# Patient Record
Sex: Female | Born: 1990
Health system: Southern US, Community
[De-identification: ages and names within clinical notes are randomized; demographics above are authoritative.]

## PROBLEM LIST (undated history)

## (undated) DIAGNOSIS — K589 Irritable bowel syndrome without diarrhea: Secondary | ICD-10-CM

## (undated) DIAGNOSIS — F419 Anxiety disorder, unspecified: Secondary | ICD-10-CM

## (undated) HISTORY — DX: Anxiety disorder, unspecified: F41.9

## (undated) HISTORY — DX: Irritable bowel syndrome without diarrhea: K58.9

---

## 2013-10-24 ENCOUNTER — Encounter (HOSPITAL_COMMUNITY): Payer: Self-pay | Admitting: Emergency Medicine

## 2013-10-24 ENCOUNTER — Emergency Department (HOSPITAL_COMMUNITY)
Admission: EM | Admit: 2013-10-24 | Discharge: 2013-10-25 | Disposition: A | Discharge status: Home / Self Care | Payer: BC Managed Care – PPO | Attending: Emergency Medicine | Admitting: Emergency Medicine

## 2013-10-24 DIAGNOSIS — S0990XA Unspecified injury of head, initial encounter: Secondary | ICD-10-CM | POA: Diagnosis present

## 2013-10-24 DIAGNOSIS — S0100XA Unspecified open wound of scalp, initial encounter: Secondary | ICD-10-CM | POA: Diagnosis not present

## 2013-10-24 DIAGNOSIS — W19XXXA Unspecified fall, initial encounter: Secondary | ICD-10-CM

## 2013-10-24 DIAGNOSIS — W1809XA Striking against other object with subsequent fall, initial encounter: Secondary | ICD-10-CM | POA: Diagnosis not present

## 2013-10-24 DIAGNOSIS — Y990 Civilian activity done for income or pay: Secondary | ICD-10-CM | POA: Insufficient documentation

## 2013-10-24 DIAGNOSIS — Y9389 Activity, other specified: Secondary | ICD-10-CM | POA: Diagnosis not present

## 2013-10-24 DIAGNOSIS — S0180XA Unspecified open wound of other part of head, initial encounter: Secondary | ICD-10-CM | POA: Diagnosis not present

## 2013-10-24 DIAGNOSIS — IMO0002 Reserved for concepts with insufficient information to code with codable children: Secondary | ICD-10-CM | POA: Insufficient documentation

## 2013-10-24 DIAGNOSIS — Y9289 Other specified places as the place of occurrence of the external cause: Secondary | ICD-10-CM | POA: Diagnosis not present

## 2013-10-24 DIAGNOSIS — S0101XA Laceration without foreign body of scalp, initial encounter: Secondary | ICD-10-CM

## 2013-10-24 NOTE — ED Provider Notes (Signed)
CSN: [id number]     Arrival date & time 10/24/13  2249 History   First MD Initiated Contact with Patient 10/24/13 2347   This chart was scribed for nurse practitioner Earley Favor, NP working with April Smitty Cords, MD, by Andrew Au, ED Scribe. This patient was seen in room WTR6/WTR6 and the patient's care was started at 11:56 PM.  Chief Complaint  Patient presents with  . Head Injury   Patient is a 23 y.o. female presenting with head injury. The history is provided by the patient. No language interpreter was used.  Head Injury Location:  L temporal Time since incident:  3 hours Mechanism of injury: fall   Pain details:    Quality:  Aching   Severity:  Mild   Timing:  Constant   Progression:  Improving Chronicity:  New Relieved by:  Rest Worsened by:  Nothing tried Associated symptoms: headache   Associated symptoms: no nausea, no neck pain and no vomiting     Amanda Chavez is a 23 y.o. female who presents to the Emergency Department complaining of a head injury. Pt slipped on a puddle of water, causing her to fall and hit her head on the wall. Pt now has an improving HA and mild light headedness. Pt states she took aspirin with relief to HA. She denies LOC, nausea and emesis. Pt denies neck pain.   History reviewed. No pertinent past medical history. History reviewed. No pertinent past surgical history. Family History  Problem Relation Age of Onset  . CAD Other    History  Substance Use Topics  . Smoking status: Never Smoker   . Smokeless tobacco: Not on file  . Alcohol Use: Yes     Comment: occ   OB History   Grav Para Term Preterm Abortions TAB SAB Ect Mult Living                 Review of Systems  Constitutional: Negative for activity change.  Eyes: Negative for visual disturbance.  Gastrointestinal: Negative for nausea and vomiting.  Musculoskeletal: Negative for neck pain.  Skin: Positive for wound.  Neurological: Positive for light-headedness and headaches.  Negative for dizziness and syncope.    Allergies  Sulfa antibiotics  Home Medications   Prior to Admission medications   Not on File   BP 108/72  Pulse 86  Temp(Src) 98.7 F (37.1 C) (Oral)  Resp 18  SpO2 100%  LMP 10/16/2013 Physical Exam  Nursing note and vitals reviewed. Constitutional: She is oriented to person, place, and time. She appears well-developed and well-nourished. No distress.  HENT:  Head: Normocephalic. Head is with laceration.    Right Ear: No hemotympanum.  Left Ear: No hemotympanum.  Eyes: Conjunctivae and EOM are normal. Pupils are equal, round, and reactive to light.  Neck: Normal range of motion. Neck supple.  Cardiovascular: Normal rate.   Pulmonary/Chest: Effort normal.  Musculoskeletal: Normal range of motion.  Neurological: She is alert and oriented to person, place, and time.  Skin: Skin is warm and dry. Abrasion and laceration noted.  Abrasion of the lateral aspect of left patella. .5 cm superficial laceration to the left hairline of the temple.  Psychiatric: She has a normal mood and affect. Her behavior is normal.    ED Course  Procedures (including critical care time) Labs Review Labs Reviewed - No data to display  Imaging Review No results found.   EKG Interpretation None      MDM   Final diagnoses:  Fall, initial encounter  Scalp laceration, initial encounter  Minor head injury without loss of consciousness, initial encounter  no sign of head injury at this time.  I personally performed the services described in this documentation, which was scribed in my presence. The recorded information has been reviewed and is accurate.      Arman Filter, NP 10/25/13 [phone]

## 2013-10-24 NOTE — ED Notes (Signed)
Pt states she slipped in a puddle of water on the floor and fell  Pt states when she fell she hit her head on the wall  Denies LOC  Pt states she had a headache earlier but it is easing off  Pt states she took advil after it happened  Denies blurred vision but has a little light headedness

## 2013-10-24 NOTE — ED Notes (Signed)
Pt reports she fell while at work tonight, slipped on wet floor, hit the corner of the wall.  Small lac noted on L side of her head.  Denies LOC

## 2013-10-25 NOTE — Discharge Instructions (Signed)
Your laceration does not need suturing  Keep the area clean and apply a small amount of antibiotic ointment 1-2 times a day for the next day

## 2013-10-25 NOTE — ED Provider Notes (Signed)
Medical screening examination/treatment/procedure(s) were performed by non-physician practitioner and as supervising physician I was immediately available for consultation/collaboration.   EKG Interpretation None       Alfonza Toft K Braven Wolk-Rasch, MD 10/25/13 0244 

## 2018-07-30 DIAGNOSIS — Z2089 Contact with and (suspected) exposure to other communicable diseases: Secondary | ICD-10-CM | POA: Diagnosis not present

## 2018-07-30 DIAGNOSIS — F411 Generalized anxiety disorder: Secondary | ICD-10-CM | POA: Diagnosis not present

## 2018-09-17 DIAGNOSIS — Z131 Encounter for screening for diabetes mellitus: Secondary | ICD-10-CM | POA: Diagnosis not present

## 2018-09-17 DIAGNOSIS — Z13220 Encounter for screening for lipoid disorders: Secondary | ICD-10-CM | POA: Diagnosis not present

## 2018-09-17 DIAGNOSIS — Z0000 Encounter for general adult medical examination without abnormal findings: Secondary | ICD-10-CM | POA: Diagnosis not present

## 2018-09-17 DIAGNOSIS — R5383 Other fatigue: Secondary | ICD-10-CM | POA: Diagnosis not present

## 2018-09-17 DIAGNOSIS — L659 Nonscarring hair loss, unspecified: Secondary | ICD-10-CM | POA: Diagnosis not present

## 2018-09-30 DIAGNOSIS — L680 Hirsutism: Secondary | ICD-10-CM | POA: Diagnosis not present

## 2018-09-30 DIAGNOSIS — Z01419 Encounter for gynecological examination (general) (routine) without abnormal findings: Secondary | ICD-10-CM | POA: Diagnosis not present

## 2018-09-30 DIAGNOSIS — Z6825 Body mass index (BMI) 25.0-25.9, adult: Secondary | ICD-10-CM | POA: Diagnosis not present

## 2018-10-20 DIAGNOSIS — Z23 Encounter for immunization: Secondary | ICD-10-CM | POA: Diagnosis not present

## 2019-01-14 DIAGNOSIS — Z2089 Contact with and (suspected) exposure to other communicable diseases: Secondary | ICD-10-CM | POA: Diagnosis not present

## 2019-02-07 DIAGNOSIS — Z20828 Contact with and (suspected) exposure to other viral communicable diseases: Secondary | ICD-10-CM | POA: Diagnosis not present

## 2019-02-10 DIAGNOSIS — Z20828 Contact with and (suspected) exposure to other viral communicable diseases: Secondary | ICD-10-CM | POA: Diagnosis not present

## 2019-06-14 DIAGNOSIS — K581 Irritable bowel syndrome with constipation: Secondary | ICD-10-CM | POA: Diagnosis not present

## 2019-07-07 ENCOUNTER — Encounter: Payer: Self-pay | Admitting: Physician Assistant

## 2019-07-29 ENCOUNTER — Encounter: Payer: Self-pay | Admitting: Physician Assistant

## 2019-07-29 ENCOUNTER — Ambulatory Visit: Payer: BC Managed Care – PPO | Admitting: Physician Assistant

## 2019-07-29 VITALS — BP 117/76 | HR 73 | Ht 64.0 in | Wt 153.0 lb

## 2019-07-29 DIAGNOSIS — K581 Irritable bowel syndrome with constipation: Secondary | ICD-10-CM

## 2019-07-29 MED ORDER — LUBIPROSTONE 8 MCG PO CAPS: 8.0000 ug | ORAL_CAPSULE | Freq: Two times a day (BID) | ORAL | 3 refills | Status: DC

## 2019-07-29 NOTE — Patient Instructions (Addendum)
If you are age 28 or older, your body mass index should be between 23-30. Your Body mass index is 26.26 kg/m. If this is out of the aforementioned range listed, please consider follow up with your Primary Care Provider.  If you are age 70 or younger, your body mass index should be between 19-25. Your Body mass index is 26.26 kg/m. If this is out of the aformentioned range listed, please consider follow up with your Primary Care Provider.   START Amitiza 8 mg 1 capsule twice daily at breakfast and dinner. This has been sent to your pharmacy.  You have been scheduled to follow up with Hyacinth Meeker, PA-C on September 02, 2019 at 8:30 am.

## 2019-07-29 NOTE — Progress Notes (Signed)
Reviewed and agree with management plans. ? ?Sissy Goetzke L. Viriginia Amendola, MD, MPH  ?

## 2019-07-29 NOTE — Progress Notes (Signed)
Chief Complaint: Constipation  HPI:    Amanda Chavez is a 29 year old Caucasian female with a past medical history as listed below including anxiety and IBS, who was referred to me by Norberta Keens, PA* for a complaint of constipation.      Today, the patient tells me that since middle school she has had trouble with constipation, she can go up to a week without having a bowel movement, typically then she will use laxatives over the weekend which consists of Dulcolax 3 tabs daily and will repeat this the next day if she does not have movement.  Even when she does have a bowel movement it feels incomplete and the days in between she has a lot of bloating and excess gas which results in burping with abdominal distention which is uncomfortable.  Has tried MiraLAX in the past which does not help.  Has also tried Linzess in the past when seeing [facility] about 3 years ago, but this gave her nausea.  Tries to eat a high-fiber diet with multiple fiber one bars a day and veggies and fruit as well as increased water intake and exercise 4 out of 5 days a week but regardless of this is still constipated.    Denies fever, chills, blood in her stool, family history of colon cancer symptoms that awaken her from sleep.  Past Medical History:  Diagnosis Date  . Anxiety   . IBS (irritable bowel syndrome)     History reviewed. No pertinent surgical history.  Current Outpatient Medications  Medication Sig Dispense Refill  . JUNEL FE 1/20 1-20 MG-MCG tablet Take 1 tablet by mouth daily.    . Probiotic Product (PROBIOTIC DAILY PO) Take 1 capsule by mouth daily.    Marland Kitchen venlafaxine (EFFEXOR) 75 MG tablet Take 75 mg by mouth at bedtime.     No current facility-administered medications for this visit.    Allergies as of 07/29/2019 - Review Complete 07/29/2019  Allergen Reaction Noted  . Sulfa antibiotics  10/24/2013    Family History  Problem Relation Age of Onset  . CAD Other   . Diabetes Mother   . Ovarian  cancer Maternal Grandmother   . Heart defect Maternal Grandmother   . Heart disease Maternal Grandfather   . Uterine cancer Paternal Aunt     Social History   Socioeconomic History  . Marital status: Single    Spouse name: Not on file  . Number of children: Not on file  . Years of education: Not on file  . Highest education level: Not on file  Occupational History  . Not on file  Tobacco Use  . Smoking status: Never Smoker  Substance and Sexual Activity  . Alcohol use: Yes    Comment: occ  . Drug use: No  . Sexual activity: Not on file  Other Topics Concern  . Not on file  Social History Narrative  . Not on file   Social Determinants of Health   Financial Resource Strain:   . Difficulty of Paying Living Expenses:   Food Insecurity:   . Worried About Charity fundraiser in the Last Year:   . Arboriculturist in the Last Year:   Transportation Needs:   . Film/video editor (Medical):   Marland Kitchen Lack of Transportation (Non-Medical):   Physical Activity:   . Days of Exercise per Week:   . Minutes of Exercise per Session:   Stress:   . Feeling of Stress :  Social Connections:   . Frequency of Communication with Friends and Family:   . Frequency of Social Gatherings with Friends and Family:   . Attends Religious Services:   . Active Member of Clubs or Organizations:   . Attends Banker Meetings:   Marland Kitchen Marital Status:   Intimate Partner Violence:   . Fear of Current or Ex-Partner:   . Emotionally Abused:   Marland Kitchen Physically Abused:   . Sexually Abused:     Review of Systems:    Constitutional: No weight loss, fever or chills Skin: No rash  Cardiovascular: No chest pain  Respiratory: No SOB  Gastrointestinal: See HPI and otherwise negative Genitourinary: No dysuria  Neurological: No headache, dizziness or syncope Musculoskeletal: No new muscle or joint pain Hematologic: No bleeding Psychiatric: +anxiety   Physical Exam:  Vital signs: BP 117/76    Pulse 73   Ht 5\' 4"  (1.626 m)   Wt 153 lb (69.4 kg)   BMI 26.26 kg/m   Constitutional:   Pleasant Caucasian female appears to be in NAD, Well developed, Well nourished, alert and cooperative Head:  Normocephalic and atraumatic. Eyes:   PEERL, EOMI. No icterus. Conjunctiva pink. Ears:  Normal auditory acuity. Neck:  Supple Throat: Oral cavity and pharynx without inflammation, swelling or lesion.  Respiratory: Respirations even and unlabored. Lungs clear to auscultation bilaterally.   No wheezes, crackles, or rhonchi.  Cardiovascular: Normal S1, S2. No MRG. Regular rate and rhythm. No peripheral edema, cyanosis or pallor.  Gastrointestinal:  Soft, nondistended, nontender. No rebound or guarding. Normal bowel sounds. No appreciable masses or hepatomegaly. Rectal:  Not performed.  Msk:  Symmetrical without gross deformities. Without edema, no deformity or joint abnormality.  Neurologic:  Alert and  oriented x4;  grossly normal neurologically.  Skin:   Dry and intact without significant lesions or rashes. Psychiatric:  Demonstrates good judgement and reason without abnormal affect or behaviors.  No recent labs or imaging.  Assessment: 1.  IBS-C: With patient's history of anxiety and the symptoms occurring since middle school, they are consistent with IBS, Linzess has given her nausea in the past, Dulcolax is sometimes helpful, MiraLAX no result  Plan: 1.  Started the patient on Amitiza 8 mcg twice daily with a meal.  Prescribe #60 with 5 refills.  Told the patient to call if this is too expensive for her at the pharmacy.  If it is then would try Motegrity 2.  Continue high-fiber diet, water and exercise 3.  Patient to follow in clinic with me in 4-6 weeks or sooner if necessary.  Discussed using MyChart to let me know how things are going. Patient assigned to Dr. this morning.  Orvan Falconer, PA-C [facility] 07/29/2019, 9:02 AM  Cc: 09/28/2019, PA*

## 2019-08-02 ENCOUNTER — Telehealth: Payer: Self-pay

## 2019-08-02 NOTE — Telephone Encounter (Signed)
A prior authorization has been started for patient to take Lubiprostone (Amitiza) through Cover My Meds.

## 2019-08-04 ENCOUNTER — Telehealth: Payer: Self-pay

## 2019-08-04 MED ORDER — MOTEGRITY 2 MG PO TABS: 2.0000 mg | ORAL_TABLET | Freq: Every day | ORAL | 5 refills | Status: DC

## 2019-08-04 NOTE — Telephone Encounter (Signed)
-----   Message from Unk Lightning, [place] sent at 08/04/2019 10:57 AM EDT ----- Regarding: needs motegrity Can you send in Motegrity for this patient, 2 mg po qd for IBS-C. #30 with 5 refills  Thanks-JLL

## 2019-08-04 NOTE — Telephone Encounter (Signed)
Prescription sent as requested.

## 2019-08-05 ENCOUNTER — Telehealth: Payer: Self-pay | Admitting: General Surgery

## 2019-08-05 NOTE — Telephone Encounter (Signed)
Amatiza denied. rx for Motegrity sent in by Hyacinth Meeker, PA request completed through cover my [facility]

## 2019-08-08 ENCOUNTER — Telehealth: Payer: Self-pay

## 2019-08-08 NOTE — Telephone Encounter (Signed)
Prior Authorization has been Denied

## 2019-08-08 NOTE — Telephone Encounter (Signed)
Denial on Motegrity came back stating that it would cover Trulance before covering Motegrity, but would still need prior authorization for coverage. Would you like to have the patient try Trulance?

## 2019-08-08 NOTE — Telephone Encounter (Signed)
Yes please. Thanks-JLL 

## 2019-08-09 MED ORDER — TRULANCE 3 MG PO TABS: 1.0000 | ORAL_TABLET | Freq: Every day | ORAL | 5 refills | Status: DC

## 2019-08-09 NOTE — Telephone Encounter (Signed)
Trulance has been sent into the pharmacy awaiting denial from the pharmacy for prior authorization.

## 2019-08-09 NOTE — Telephone Encounter (Signed)
Denial from pharmacy has came to the office, prior authorization has been submitted through cover my med and has been approved.   Approvedtoday Effective from 08/09/2019 through 08/07/2020.  A voicemail has been left for the patient to notify her.

## 2019-09-02 ENCOUNTER — Ambulatory Visit: Payer: BC Managed Care – PPO | Admitting: Physician Assistant

## 2019-09-02 ENCOUNTER — Encounter: Payer: Self-pay | Admitting: Physician Assistant

## 2019-09-02 VITALS — BP 88/60 | HR 84 | Ht 63.78 in

## 2019-09-02 DIAGNOSIS — K581 Irritable bowel syndrome with constipation: Secondary | ICD-10-CM | POA: Diagnosis not present

## 2019-09-02 MED ORDER — LINACLOTIDE 290 MCG PO CAPS: 290.0000 ug | ORAL_CAPSULE | Freq: Every day | ORAL | 3 refills | Status: DC

## 2019-09-02 NOTE — Progress Notes (Signed)
Chief Complaint: Follow-up IBS-C  HPI:    Amanda Chavez is a 29 year old female, assigned to Dr. Orvan Falconer at last visit, with a past medical history as listed below including anxiety and IBS, who returns to clinic today for follow-up of her IBS-C.    07/29/2019 patient was seen in clinic and described troubles with constipation since middle school.  Had tried Dulcolax and MiraLAX in the past which did not help.  Also tried Linzess which gave her nausea.  Tried to do a high-fiber diet with increased water intake.  At that time start the patient on Amitiza 8 mcg twice a day with a meal.  Discussed that if this is too expensive for her than you try Motegrity.    08/04/2019 patient told us Amitiza was too expensive.  Motegrity was sent in 2 mg p.o. daily.  Motegrity was then denied and Trulance was sent in 3 mg p.o. daily.    Today, the patient presents to clinic and explains that her insurance did not want to approve Amitiza which she took 8 mcg for about a week of samples, tells me she is not really sure if was helping or not but then her insurance did not pay for it so she could not continue.  We then tried to get Motegrity approved and they would not, she has took Trulance once daily for 2 weeks and had diarrhea twice and had horrible cramping and abdominal pain so she stopped the medicine.  Over the past 2 weeks she has just been "loading up on the fiber" and drinking lots of water.  She is able to have small bowel movements but there is a lot of straining and she never feels completely empty.    Denies fever, chills, weight loss or blood in her stool.  Past Medical History:  Diagnosis Date  . Anxiety   . IBS (irritable bowel syndrome)     No past surgical history on file.  Current Outpatient Medications  Medication Sig Dispense Refill  . JUNEL FE 1/20 1-20 MG-MCG tablet Take 1 tablet by mouth daily.    Marland Kitchen lubiprostone (AMITIZA) 8 MCG capsule Take 1 capsule (8 mcg total) by mouth 2 (two) times daily  with a meal. 60 capsule 3  . Plecanatide (TRULANCE) 3 MG TABS Take 1 tablet by mouth daily. 30 tablet 5  . Probiotic Product (PROBIOTIC DAILY PO) Take 1 capsule by mouth daily.    Marland Kitchen venlafaxine (EFFEXOR) 75 MG tablet Take 75 mg by mouth at bedtime.     No current facility-administered medications for this visit.    Allergies as of 09/02/2019 - Review Complete 07/29/2019  Allergen Reaction Noted  . Sulfa antibiotics  10/24/2013    Family History  Problem Relation Age of Onset  . CAD Other   . Diabetes Mother   . Ovarian cancer Maternal Grandmother   . Heart defect Maternal Grandmother   . Heart disease Maternal Grandfather   . Uterine cancer Paternal Aunt     Social History   Socioeconomic History  . Marital status: Single    Spouse name: Not on file  . Number of children: Not on file  . Years of education: Not on file  . Highest education level: Not on file  Occupational History  . Not on file  Tobacco Use  . Smoking status: Never Smoker  . Smokeless tobacco: Never Used  Substance and Sexual Activity  . Alcohol use: Yes    Comment: occ  . Drug use:  No  . Sexual activity: Not on file  Other Topics Concern  . Not on file  Social History Narrative  . Not on file   Social Determinants of Health   Financial Resource Strain:   . Difficulty of Paying Living Expenses:   Food Insecurity:   . Worried About Programme researcher, broadcasting/film/video in the Last Year:   . Barista in the Last Year:   Transportation Needs:   . Freight forwarder (Medical):   Marland Kitchen Lack of Transportation (Non-Medical):   Physical Activity:   . Days of Exercise per Week:   . Minutes of Exercise per Session:   Stress:   . Feeling of Stress :   Social Connections:   . Frequency of Communication with Friends and Family:   . Frequency of Social Gatherings with Friends and Family:   . Attends Religious Services:   . Active Member of Clubs or Organizations:   . Attends Banker Meetings:   Marland Kitchen  Marital Status:   Intimate Partner Violence:   . Fear of Current or Ex-Partner:   . Emotionally Abused:   Marland Kitchen Physically Abused:   . Sexually Abused:     Review of Systems:    Constitutional: No weight loss, fever or chills Cardiovascular: No chest pain   Respiratory: No SOB Gastrointestinal: See HPI and otherwise negative   Physical Exam:  Vital signs: BP (!) 88/60 (BP Location: Left Arm, Patient Position: Sitting, Cuff Size: Normal)   Pulse 84   Ht 5' 3.78" (1.62 m) Comment: height measured without shoes  LMP 08/12/2019   BMI 26.44 kg/m   Constitutional:   Pleasant Caucasian female appears to be in NAD, Well developed, Well nourished, alert and cooperative Respiratory: Respirations even and unlabored. Lungs clear to auscultation bilaterally.   No wheezes, crackles, or rhonchi.  Cardiovascular: Normal S1, S2. No MRG. Regular rate and rhythm. No peripheral edema, cyanosis or pallor.  Gastrointestinal:  Soft, nondistended, nontender. No rebound or guarding. Increased BS all four quadrants. No appreciable masses or hepatomegaly. Rectal:  Not performed.  Psychiatric: Demonstrates good judgement and reason without abnormal affect or behaviors.  No recent labs or imaging.  Assessment: 1.  IBS-C: Question if Amitiza worked, could not get approved by insurance, could not approve Motegrity, Trulance caused abdominal cramping and diarrhea  Plan: 1.  Patient has tried Linzess in the past which she thinks caused nausea but she will try this again.  Provided her with samples of Linzess 290 mcg daily for 2 weeks.  She will let us know if this works for her and we will try to get it approved. 2.  Continue fiber and water. 3.  Patient to follow in clinic with me in the future per direction, we will try to do some of this trial and error of different products without having her come back into the clinic all of the time. 4.  If nothing is helping then would recommend a colonoscopy.  Hyacinth Meeker, PA-C [facility] 09/02/2019, 8:28 AM

## 2019-09-02 NOTE — Progress Notes (Signed)
Reviewed and agree with management plans. ? ?Loi Rennaker L. Mckinzee Spirito, MD, MPH  ?

## 2019-09-02 NOTE — Patient Instructions (Signed)
If you are age 28 or older, your body mass index should be between 23-30. Your Body mass index is 26.44 kg/m. If this is out of the aforementioned range listed, please consider follow up with your Primary Care Provider.  If you are age 60 or younger, your body mass index should be between 19-25. Your Body mass index is 26.44 kg/m. If this is out of the aformentioned range listed, please consider follow up with your Primary Care Provider.   We have sent the following medications to your pharmacy for you to pick up at your convenience: Linzess 290 mcg daily.

## 2019-10-19 DIAGNOSIS — Z03818 Encounter for observation for suspected exposure to other biological agents ruled out: Secondary | ICD-10-CM | POA: Diagnosis not present

## 2019-10-26 DIAGNOSIS — Z20822 Contact with and (suspected) exposure to covid-19: Secondary | ICD-10-CM | POA: Diagnosis not present

## 2019-11-22 DIAGNOSIS — Z20822 Contact with and (suspected) exposure to covid-19: Secondary | ICD-10-CM | POA: Diagnosis not present

## 2019-12-07 DIAGNOSIS — Z01419 Encounter for gynecological examination (general) (routine) without abnormal findings: Secondary | ICD-10-CM | POA: Diagnosis not present

## 2019-12-07 DIAGNOSIS — Z6825 Body mass index (BMI) 25.0-25.9, adult: Secondary | ICD-10-CM | POA: Diagnosis not present

## 2020-03-26 DIAGNOSIS — Z20822 Contact with and (suspected) exposure to covid-19: Secondary | ICD-10-CM | POA: Diagnosis not present

## 2020-04-20 DIAGNOSIS — F321 Major depressive disorder, single episode, moderate: Secondary | ICD-10-CM | POA: Diagnosis not present

## 2020-04-20 DIAGNOSIS — R42 Dizziness and giddiness: Secondary | ICD-10-CM | POA: Diagnosis not present

## 2020-04-20 DIAGNOSIS — R599 Enlarged lymph nodes, unspecified: Secondary | ICD-10-CM | POA: Diagnosis not present

## 2020-06-14 ENCOUNTER — Emergency Department (HOSPITAL_BASED_OUTPATIENT_CLINIC_OR_DEPARTMENT_OTHER)
Admission: EM | Admit: 2020-06-14 | Discharge: 2020-06-14 | Disposition: A | Discharge status: Home / Self Care | Payer: BC Managed Care – PPO | Attending: Emergency Medicine | Admitting: Emergency Medicine

## 2020-06-14 ENCOUNTER — Encounter (HOSPITAL_BASED_OUTPATIENT_CLINIC_OR_DEPARTMENT_OTHER): Payer: Self-pay | Admitting: *Deleted

## 2020-06-14 ENCOUNTER — Other Ambulatory Visit: Payer: Self-pay

## 2020-06-14 ENCOUNTER — Emergency Department (HOSPITAL_BASED_OUTPATIENT_CLINIC_OR_DEPARTMENT_OTHER): Payer: BC Managed Care – PPO | Admitting: Radiology

## 2020-06-14 DIAGNOSIS — S90932A Unspecified superficial injury of left great toe, initial encounter: Secondary | ICD-10-CM | POA: Diagnosis not present

## 2020-06-14 DIAGNOSIS — Y9302 Activity, running: Secondary | ICD-10-CM | POA: Insufficient documentation

## 2020-06-14 DIAGNOSIS — S92415A Nondisplaced fracture of proximal phalanx of left great toe, initial encounter for closed fracture: Secondary | ICD-10-CM

## 2020-06-14 DIAGNOSIS — S92412A Displaced fracture of proximal phalanx of left great toe, initial encounter for closed fracture: Secondary | ICD-10-CM | POA: Diagnosis not present

## 2020-06-14 DIAGNOSIS — R609 Edema, unspecified: Secondary | ICD-10-CM

## 2020-06-14 MED ORDER — IBUPROFEN 600 MG PO TABS: 600.0000 mg | ORAL_TABLET | Freq: Four times a day (QID) | ORAL | 0 refills | Status: AC | PRN

## 2020-06-14 MED ORDER — KETOROLAC TROMETHAMINE 30 MG/ML IJ SOLN
30.0000 mg | Freq: Once | INTRAMUSCULAR | Status: AC
Start: 2020-06-14 — End: 2020-06-14
  Administered 2020-06-14: 30 mg via INTRAMUSCULAR
  Filled 2020-06-14: qty 1

## 2020-06-14 NOTE — ED Notes (Signed)
Pt discharged home after verbalizing understanding of discharge instructions; nad noted. 
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
## 2020-06-15 ENCOUNTER — Telehealth: Payer: Self-pay

## 2020-06-15 NOTE — Telephone Encounter (Signed)
So he said about a week in a half we can see her and followup No rush, nothing we can do  Can you make an appointment

## 2020-06-15 NOTE — Telephone Encounter (Signed)
This patient was seen in the ED and broke her toe, when does she need to followup with [place]?

## 2020-06-15 NOTE — Telephone Encounter (Signed)
Let her know that I reviewed the x-rays and there is really nothing to do for this other than time.  Should do well with time but will take certainly 6 to 8 weeks to heal the bone.  The next time we really need to see her is in a week and a half to repeat x-rays to make sure that it is staying in good alignment.

## 2020-06-19 ENCOUNTER — Telehealth: Payer: Self-pay

## 2020-06-19 NOTE — Telephone Encounter (Signed)
Please advise 

## 2020-06-19 NOTE — Telephone Encounter (Signed)
Counseled her that that can be normal after fracture such as hers when the foot sends more fluid and blood supply to try to heal itself.  It does create swelling and can put pressure on the nerves around the foot.  Elevation and anti-inflammatories such as Aleve twice a day or 3-4 Advil twice a day will help.  She can also try topical Voltaren gel as an anti-inflammatory to put on the foot.

## 2020-06-19 NOTE — Telephone Encounter (Signed)
Patient called and wanted to let Dr. Magnus Ivan know that she is having some numbness on the opposite side of her top left foot. Patient does have an appointment on 06/26/2020, and stated she can call her PCP if needed.  CB# [phone].  Please advise.  Thank you.

## 2020-06-26 ENCOUNTER — Ambulatory Visit: Payer: Self-pay

## 2020-06-26 ENCOUNTER — Ambulatory Visit (INDEPENDENT_AMBULATORY_CARE_PROVIDER_SITE_OTHER): Payer: BC Managed Care – PPO | Admitting: Orthopaedic Surgery

## 2020-06-26 DIAGNOSIS — S9032XD Contusion of left foot, subsequent encounter: Secondary | ICD-10-CM | POA: Diagnosis not present

## 2020-06-26 DIAGNOSIS — S92415D Nondisplaced fracture of proximal phalanx of left great toe, subsequent encounter for fracture with routine healing: Secondary | ICD-10-CM | POA: Diagnosis not present

## 2020-06-26 DIAGNOSIS — M79675 Pain in left toe(s): Secondary | ICD-10-CM | POA: Diagnosis not present
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
## 2020-07-24 ENCOUNTER — Encounter: Payer: Self-pay | Admitting: Orthopaedic Surgery

## 2020-07-24 ENCOUNTER — Ambulatory Visit (INDEPENDENT_AMBULATORY_CARE_PROVIDER_SITE_OTHER): Payer: BC Managed Care – PPO | Admitting: Orthopaedic Surgery

## 2020-07-24 ENCOUNTER — Ambulatory Visit (INDEPENDENT_AMBULATORY_CARE_PROVIDER_SITE_OTHER): Payer: BC Managed Care – PPO

## 2020-07-24 ENCOUNTER — Other Ambulatory Visit: Payer: Self-pay

## 2020-07-24 DIAGNOSIS — S9032XD Contusion of left foot, subsequent encounter: Secondary | ICD-10-CM | POA: Diagnosis not present

## 2020-07-24 DIAGNOSIS — S92415D Nondisplaced fracture of proximal phalanx of left great toe, subsequent encounter for fracture with routine healing: Secondary | ICD-10-CM

## 2020-07-24 DIAGNOSIS — M79675 Pain in left toe(s): Secondary | ICD-10-CM
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
## 2020-07-25 ENCOUNTER — Encounter: Payer: Self-pay | Admitting: Orthopaedic Surgery

## 2020-08-24 DIAGNOSIS — K011 Impacted teeth: Secondary | ICD-10-CM | POA: Diagnosis not present

## 2020-09-03 ENCOUNTER — Ambulatory Visit (INDEPENDENT_AMBULATORY_CARE_PROVIDER_SITE_OTHER): Payer: BC Managed Care – PPO

## 2020-09-03 ENCOUNTER — Ambulatory Visit (INDEPENDENT_AMBULATORY_CARE_PROVIDER_SITE_OTHER): Payer: BC Managed Care – PPO | Admitting: Orthopaedic Surgery

## 2020-09-03 ENCOUNTER — Encounter: Payer: Self-pay | Admitting: Orthopaedic Surgery

## 2020-09-03 VITALS — Ht 64.0 in | Wt 138.0 lb

## 2020-09-03 DIAGNOSIS — S92415D Nondisplaced fracture of proximal phalanx of left great toe, subsequent encounter for fracture with routine healing: Secondary | ICD-10-CM
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
## 2020-09-04 ENCOUNTER — Ambulatory Visit: Payer: BC Managed Care – PPO | Admitting: Orthopaedic Surgery

## 2020-09-05 ENCOUNTER — Ambulatory Visit: Payer: BC Managed Care – PPO | Admitting: Orthopaedic Surgery

## 2020-11-30 DIAGNOSIS — R000 Tachycardia, unspecified: Secondary | ICD-10-CM | POA: Diagnosis not present

## 2020-11-30 DIAGNOSIS — R42 Dizziness and giddiness: Secondary | ICD-10-CM | POA: Diagnosis not present

## 2020-11-30 DIAGNOSIS — Z13220 Encounter for screening for lipoid disorders: Secondary | ICD-10-CM | POA: Diagnosis not present

## 2021-02-08 DIAGNOSIS — Z6824 Body mass index (BMI) 24.0-24.9, adult: Secondary | ICD-10-CM | POA: Diagnosis not present

## 2021-02-08 DIAGNOSIS — Z01419 Encounter for gynecological examination (general) (routine) without abnormal findings: Secondary | ICD-10-CM | POA: Diagnosis not present

## 2021-06-19 DIAGNOSIS — F411 Generalized anxiety disorder: Secondary | ICD-10-CM | POA: Diagnosis not present

## 2021-06-26 DIAGNOSIS — F411 Generalized anxiety disorder: Secondary | ICD-10-CM | POA: Diagnosis not present

## 2021-07-01 DIAGNOSIS — R300 Dysuria: Secondary | ICD-10-CM | POA: Diagnosis not present

## 2021-07-03 DIAGNOSIS — F411 Generalized anxiety disorder: Secondary | ICD-10-CM | POA: Diagnosis not present

## 2021-07-17 DIAGNOSIS — F411 Generalized anxiety disorder: Secondary | ICD-10-CM | POA: Diagnosis not present

## 2021-07-25 DIAGNOSIS — F411 Generalized anxiety disorder: Secondary | ICD-10-CM | POA: Diagnosis not present

## 2021-08-07 DIAGNOSIS — F411 Generalized anxiety disorder: Secondary | ICD-10-CM | POA: Diagnosis not present

## 2021-08-14 DIAGNOSIS — F411 Generalized anxiety disorder: Secondary | ICD-10-CM | POA: Diagnosis not present

## 2021-08-21 DIAGNOSIS — F411 Generalized anxiety disorder: Secondary | ICD-10-CM | POA: Diagnosis not present

## 2021-08-28 DIAGNOSIS — F411 Generalized anxiety disorder: Secondary | ICD-10-CM | POA: Diagnosis not present

## 2021-09-04 DIAGNOSIS — F411 Generalized anxiety disorder: Secondary | ICD-10-CM | POA: Diagnosis not present

## 2021-09-11 DIAGNOSIS — F411 Generalized anxiety disorder: Secondary | ICD-10-CM | POA: Diagnosis not present

## 2021-09-17 DIAGNOSIS — F321 Major depressive disorder, single episode, moderate: Secondary | ICD-10-CM | POA: Diagnosis not present

## 2021-09-17 DIAGNOSIS — F5001 Anorexia nervosa, restricting type: Secondary | ICD-10-CM | POA: Diagnosis not present

## 2021-09-18 DIAGNOSIS — F411 Generalized anxiety disorder: Secondary | ICD-10-CM | POA: Diagnosis not present

## 2021-09-25 DIAGNOSIS — F411 Generalized anxiety disorder: Secondary | ICD-10-CM | POA: Diagnosis not present

## 2021-10-02 DIAGNOSIS — F411 Generalized anxiety disorder: Secondary | ICD-10-CM | POA: Diagnosis not present

## 2021-10-23 DIAGNOSIS — F411 Generalized anxiety disorder: Secondary | ICD-10-CM | POA: Diagnosis not present

## 2021-10-30 DIAGNOSIS — F411 Generalized anxiety disorder: Secondary | ICD-10-CM | POA: Diagnosis not present

## 2021-11-04 DIAGNOSIS — R42 Dizziness and giddiness: Secondary | ICD-10-CM | POA: Diagnosis not present

## 2021-11-04 DIAGNOSIS — J342 Deviated nasal septum: Secondary | ICD-10-CM | POA: Diagnosis not present

## 2021-11-06 DIAGNOSIS — F411 Generalized anxiety disorder: Secondary | ICD-10-CM | POA: Diagnosis not present

## 2021-11-18 DIAGNOSIS — Z0000 Encounter for general adult medical examination without abnormal findings: Secondary | ICD-10-CM | POA: Diagnosis not present

## 2021-11-18 DIAGNOSIS — Z681 Body mass index (BMI) 19 or less, adult: Secondary | ICD-10-CM | POA: Diagnosis not present

## 2021-11-18 DIAGNOSIS — L309 Dermatitis, unspecified: Secondary | ICD-10-CM | POA: Diagnosis not present

## 2021-11-20 DIAGNOSIS — F411 Generalized anxiety disorder: Secondary | ICD-10-CM | POA: Diagnosis not present

## 2021-11-28 DIAGNOSIS — F411 Generalized anxiety disorder: Secondary | ICD-10-CM | POA: Diagnosis not present

## 2021-12-04 DIAGNOSIS — F411 Generalized anxiety disorder: Secondary | ICD-10-CM | POA: Diagnosis not present

## 2021-12-10 DIAGNOSIS — J3489 Other specified disorders of nose and nasal sinuses: Secondary | ICD-10-CM | POA: Diagnosis not present

## 2021-12-10 DIAGNOSIS — J343 Hypertrophy of nasal turbinates: Secondary | ICD-10-CM | POA: Diagnosis not present

## 2021-12-10 DIAGNOSIS — J302 Other seasonal allergic rhinitis: Secondary | ICD-10-CM | POA: Diagnosis not present

## 2021-12-10 DIAGNOSIS — J342 Deviated nasal septum: Secondary | ICD-10-CM | POA: Diagnosis not present

## 2021-12-11 DIAGNOSIS — F411 Generalized anxiety disorder: Secondary | ICD-10-CM | POA: Diagnosis not present

## 2021-12-18 DIAGNOSIS — F411 Generalized anxiety disorder: Secondary | ICD-10-CM | POA: Diagnosis not present

## 2021-12-25 DIAGNOSIS — F411 Generalized anxiety disorder: Secondary | ICD-10-CM | POA: Diagnosis not present

## 2021-12-31 DIAGNOSIS — J343 Hypertrophy of nasal turbinates: Secondary | ICD-10-CM | POA: Diagnosis not present

## 2021-12-31 DIAGNOSIS — J342 Deviated nasal septum: Secondary | ICD-10-CM | POA: Diagnosis not present

## 2021-12-31 DIAGNOSIS — J3489 Other specified disorders of nose and nasal sinuses: Secondary | ICD-10-CM | POA: Diagnosis not present

## 2021-12-31 DIAGNOSIS — Q308 Other congenital malformations of nose: Secondary | ICD-10-CM | POA: Diagnosis not present

## 2022-01-01 DIAGNOSIS — F411 Generalized anxiety disorder: Secondary | ICD-10-CM | POA: Diagnosis not present

## 2022-01-15 DIAGNOSIS — F411 Generalized anxiety disorder: Secondary | ICD-10-CM | POA: Diagnosis not present

## 2022-01-22 DIAGNOSIS — F411 Generalized anxiety disorder: Secondary | ICD-10-CM | POA: Diagnosis not present

## 2022-01-29 DIAGNOSIS — F411 Generalized anxiety disorder: Secondary | ICD-10-CM | POA: Diagnosis not present

## 2022-02-13 DIAGNOSIS — F411 Generalized anxiety disorder: Secondary | ICD-10-CM | POA: Diagnosis not present

## 2022-02-19 DIAGNOSIS — F411 Generalized anxiety disorder: Secondary | ICD-10-CM | POA: Diagnosis not present

## 2022-02-25 DIAGNOSIS — F411 Generalized anxiety disorder: Secondary | ICD-10-CM | POA: Diagnosis not present

## 2022-03-05 DIAGNOSIS — F411 Generalized anxiety disorder: Secondary | ICD-10-CM | POA: Diagnosis not present

## 2022-03-12 DIAGNOSIS — Z6824 Body mass index (BMI) 24.0-24.9, adult: Secondary | ICD-10-CM | POA: Diagnosis not present

## 2022-03-12 DIAGNOSIS — Z124 Encounter for screening for malignant neoplasm of cervix: Secondary | ICD-10-CM | POA: Diagnosis not present

## 2022-03-12 DIAGNOSIS — Z01419 Encounter for gynecological examination (general) (routine) without abnormal findings: Secondary | ICD-10-CM | POA: Diagnosis not present

## 2022-03-13 DIAGNOSIS — F411 Generalized anxiety disorder: Secondary | ICD-10-CM | POA: Diagnosis not present

## 2022-03-19 DIAGNOSIS — F411 Generalized anxiety disorder: Secondary | ICD-10-CM | POA: Diagnosis not present

## 2022-04-02 DIAGNOSIS — F411 Generalized anxiety disorder: Secondary | ICD-10-CM | POA: Diagnosis not present

## 2022-04-09 DIAGNOSIS — F411 Generalized anxiety disorder: Secondary | ICD-10-CM | POA: Diagnosis not present

## 2022-04-16 DIAGNOSIS — F411 Generalized anxiety disorder: Secondary | ICD-10-CM | POA: Diagnosis not present

## 2022-04-23 DIAGNOSIS — F411 Generalized anxiety disorder: Secondary | ICD-10-CM | POA: Diagnosis not present

## 2022-04-30 DIAGNOSIS — F411 Generalized anxiety disorder: Secondary | ICD-10-CM | POA: Diagnosis not present

## 2022-05-07 DIAGNOSIS — F411 Generalized anxiety disorder: Secondary | ICD-10-CM | POA: Diagnosis not present

## 2022-05-14 DIAGNOSIS — F411 Generalized anxiety disorder: Secondary | ICD-10-CM | POA: Diagnosis not present

## 2022-05-21 DIAGNOSIS — F411 Generalized anxiety disorder: Secondary | ICD-10-CM | POA: Diagnosis not present

## 2022-05-28 DIAGNOSIS — F411 Generalized anxiety disorder: Secondary | ICD-10-CM | POA: Diagnosis not present

## 2022-06-04 DIAGNOSIS — F411 Generalized anxiety disorder: Secondary | ICD-10-CM | POA: Diagnosis not present

## 2022-06-11 DIAGNOSIS — F411 Generalized anxiety disorder: Secondary | ICD-10-CM | POA: Diagnosis not present

## 2022-06-18 DIAGNOSIS — F411 Generalized anxiety disorder: Secondary | ICD-10-CM | POA: Diagnosis not present

## 2022-06-25 DIAGNOSIS — F411 Generalized anxiety disorder: Secondary | ICD-10-CM | POA: Diagnosis not present

## 2022-07-02 DIAGNOSIS — F411 Generalized anxiety disorder: Secondary | ICD-10-CM | POA: Diagnosis not present

## 2022-07-09 DIAGNOSIS — F411 Generalized anxiety disorder: Secondary | ICD-10-CM | POA: Diagnosis not present

## 2022-07-16 DIAGNOSIS — F411 Generalized anxiety disorder: Secondary | ICD-10-CM | POA: Diagnosis not present

## 2022-07-23 DIAGNOSIS — F411 Generalized anxiety disorder: Secondary | ICD-10-CM | POA: Diagnosis not present

## 2022-08-08 DIAGNOSIS — F411 Generalized anxiety disorder: Secondary | ICD-10-CM | POA: Diagnosis not present

## 2022-08-13 DIAGNOSIS — F411 Generalized anxiety disorder: Secondary | ICD-10-CM | POA: Diagnosis not present

## 2022-08-20 DIAGNOSIS — F411 Generalized anxiety disorder: Secondary | ICD-10-CM | POA: Diagnosis not present

## 2022-08-27 DIAGNOSIS — F411 Generalized anxiety disorder: Secondary | ICD-10-CM | POA: Diagnosis not present

## 2022-09-03 DIAGNOSIS — F411 Generalized anxiety disorder: Secondary | ICD-10-CM | POA: Diagnosis not present

## 2022-09-10 DIAGNOSIS — F411 Generalized anxiety disorder: Secondary | ICD-10-CM | POA: Diagnosis not present

## 2022-09-17 DIAGNOSIS — F411 Generalized anxiety disorder: Secondary | ICD-10-CM | POA: Diagnosis not present

## 2022-09-23 DIAGNOSIS — F411 Generalized anxiety disorder: Secondary | ICD-10-CM | POA: Diagnosis not present

## 2022-10-01 DIAGNOSIS — F411 Generalized anxiety disorder: Secondary | ICD-10-CM | POA: Diagnosis not present

## 2022-10-08 DIAGNOSIS — F411 Generalized anxiety disorder: Secondary | ICD-10-CM | POA: Diagnosis not present

## 2022-10-14 IMAGING — DX DG FOOT COMPLETE 3+V*L*
3 series · 3 of 3 positions shown · non-contrast
Comparison: None.

CLINICAL DATA: Crush injury

EXAM:
LEFT FOOT - COMPLETE 3+ VIEW

[foot ap]
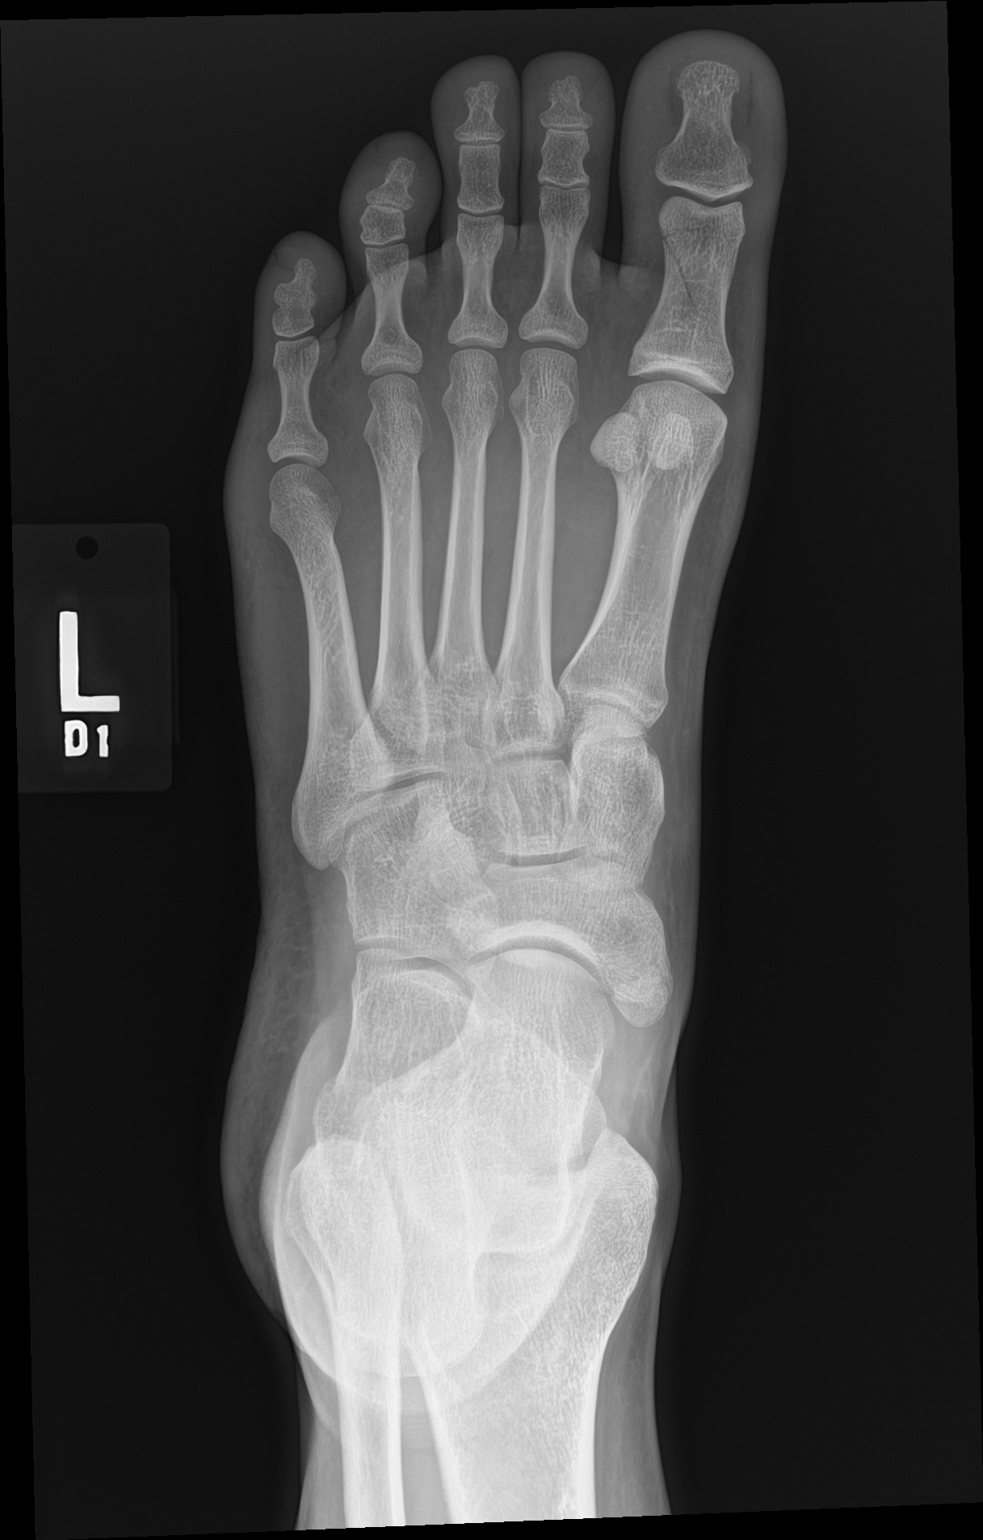

[foot obl]
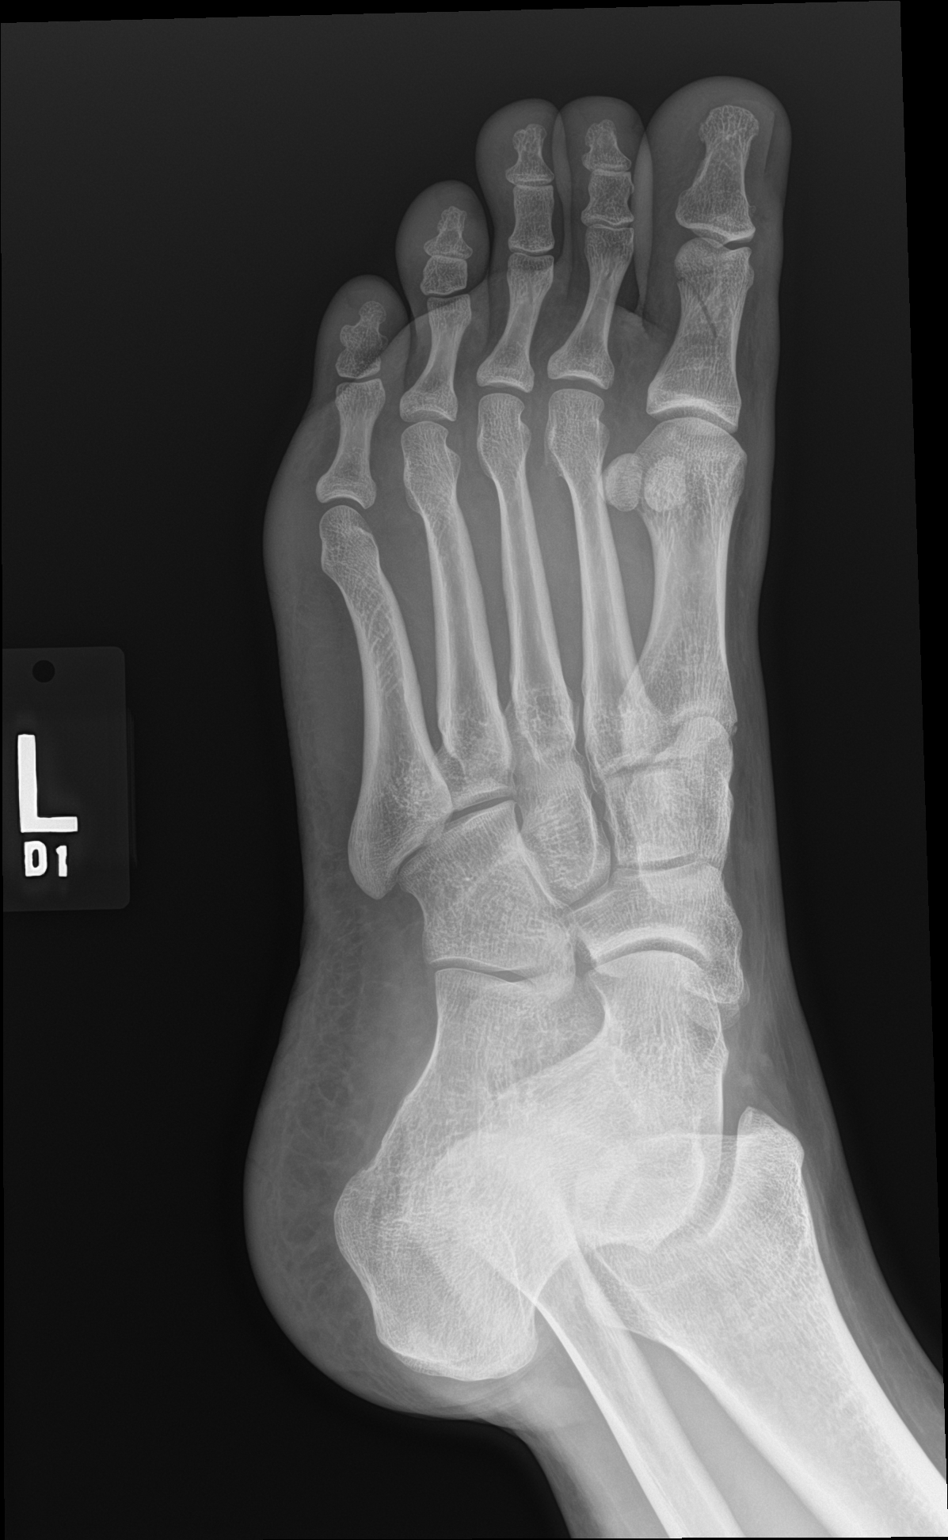

[foot lat]
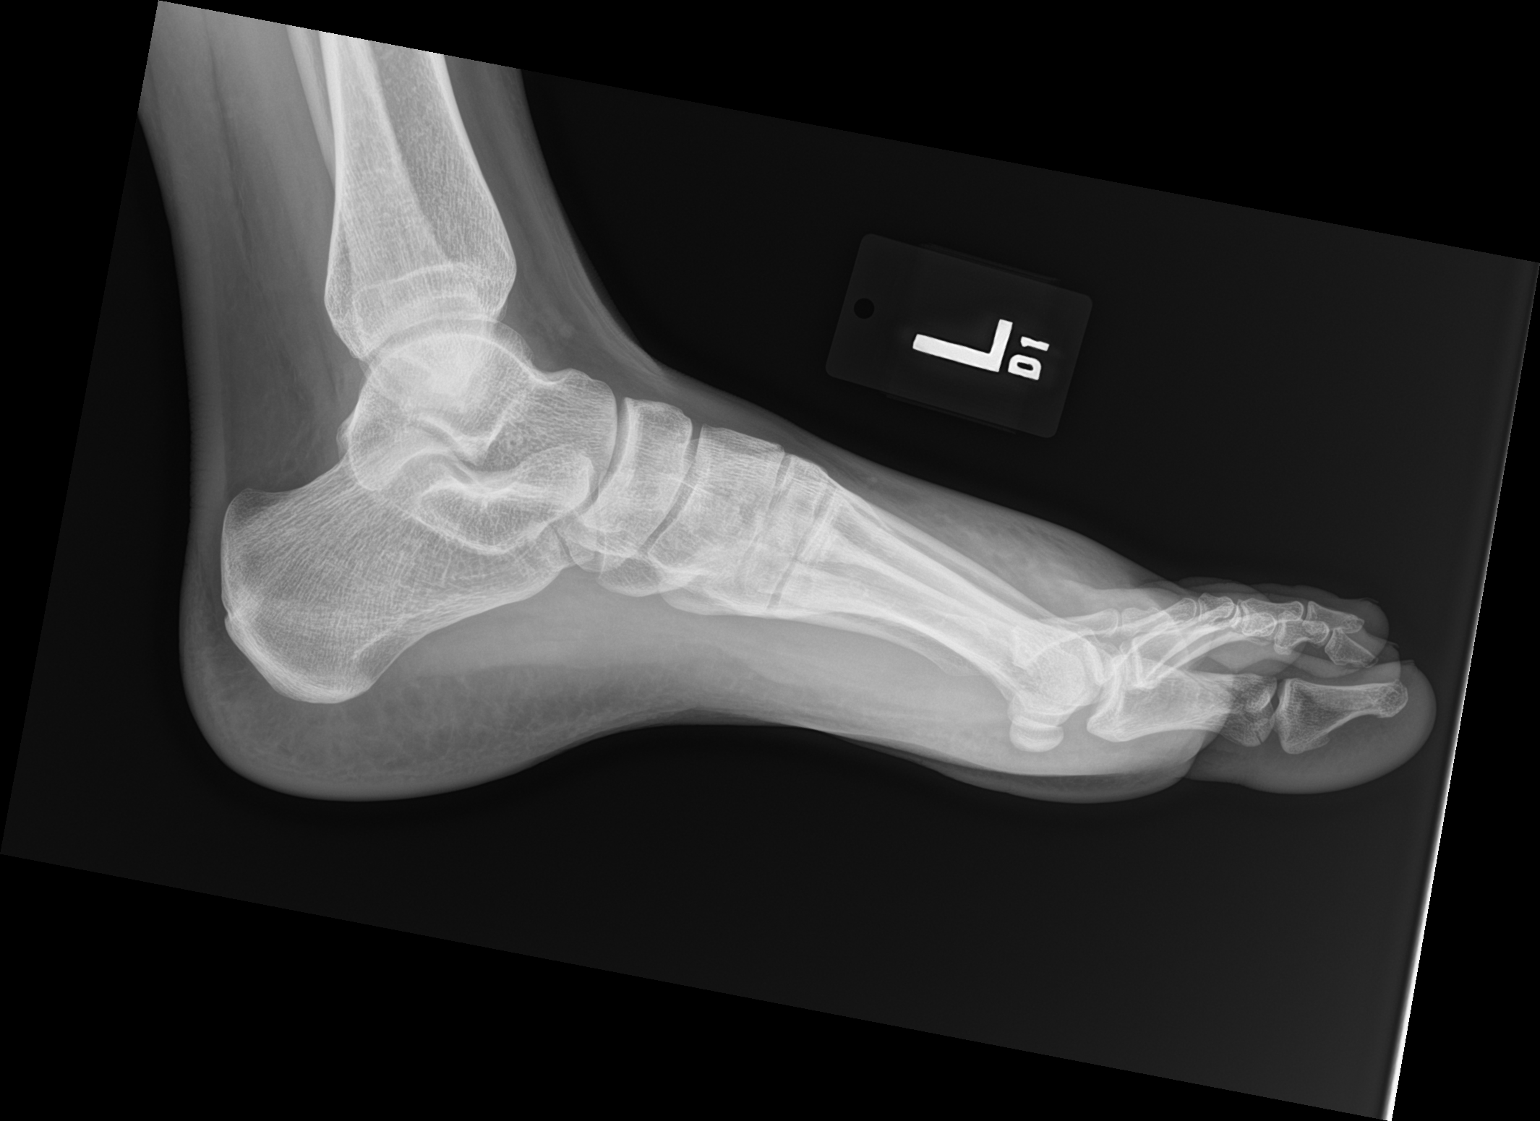

[3 of 3 positions shown; findings below may reference images not displayed]

FINDINGS: Comminuted but nondisplaced fracture of the distal aspect of the
proximal phalanx of the great toe. No evidence of extension to
either joint surface. The exam is otherwise normal.
IMPRESSION: Comminuted but nondisplaced fracture of the distal aspect of the
proximal phalanx of the great toe.

## 2022-10-15 DIAGNOSIS — F411 Generalized anxiety disorder: Secondary | ICD-10-CM | POA: Diagnosis not present

## 2022-10-22 DIAGNOSIS — F411 Generalized anxiety disorder: Secondary | ICD-10-CM | POA: Diagnosis not present

## 2022-10-29 DIAGNOSIS — F411 Generalized anxiety disorder: Secondary | ICD-10-CM | POA: Diagnosis not present

## 2022-11-05 DIAGNOSIS — F411 Generalized anxiety disorder: Secondary | ICD-10-CM | POA: Diagnosis not present

## 2022-11-05 DIAGNOSIS — E611 Iron deficiency: Secondary | ICD-10-CM | POA: Diagnosis not present

## 2022-11-05 DIAGNOSIS — R238 Other skin changes: Secondary | ICD-10-CM | POA: Diagnosis not present

## 2022-11-13 DIAGNOSIS — B009 Herpesviral infection, unspecified: Secondary | ICD-10-CM | POA: Diagnosis not present

## 2022-11-19 DIAGNOSIS — F411 Generalized anxiety disorder: Secondary | ICD-10-CM | POA: Diagnosis not present

## 2022-11-20 DIAGNOSIS — F411 Generalized anxiety disorder: Secondary | ICD-10-CM | POA: Diagnosis not present

## 2022-11-26 DIAGNOSIS — F411 Generalized anxiety disorder: Secondary | ICD-10-CM | POA: Diagnosis not present

## 2022-12-03 DIAGNOSIS — F411 Generalized anxiety disorder: Secondary | ICD-10-CM | POA: Diagnosis not present

## 2022-12-17 DIAGNOSIS — F411 Generalized anxiety disorder: Secondary | ICD-10-CM | POA: Diagnosis not present

## 2022-12-24 DIAGNOSIS — F411 Generalized anxiety disorder: Secondary | ICD-10-CM | POA: Diagnosis not present

## 2022-12-31 DIAGNOSIS — F411 Generalized anxiety disorder: Secondary | ICD-10-CM | POA: Diagnosis not present

## 2023-01-07 DIAGNOSIS — F411 Generalized anxiety disorder: Secondary | ICD-10-CM | POA: Diagnosis not present

## 2023-01-14 DIAGNOSIS — F411 Generalized anxiety disorder: Secondary | ICD-10-CM | POA: Diagnosis not present

## 2023-01-20 DIAGNOSIS — Z309 Encounter for contraceptive management, unspecified: Secondary | ICD-10-CM | POA: Diagnosis not present

## 2023-01-21 DIAGNOSIS — F411 Generalized anxiety disorder: Secondary | ICD-10-CM | POA: Diagnosis not present

## 2023-01-28 DIAGNOSIS — F411 Generalized anxiety disorder: Secondary | ICD-10-CM | POA: Diagnosis not present

## 2023-01-29 DIAGNOSIS — Z30430 Encounter for insertion of intrauterine contraceptive device: Secondary | ICD-10-CM | POA: Diagnosis not present

## 2023-01-29 DIAGNOSIS — Z3202 Encounter for pregnancy test, result negative: Secondary | ICD-10-CM | POA: Diagnosis not present

## 2023-02-04 DIAGNOSIS — F411 Generalized anxiety disorder: Secondary | ICD-10-CM | POA: Diagnosis not present

## 2023-02-18 DIAGNOSIS — F411 Generalized anxiety disorder: Secondary | ICD-10-CM | POA: Diagnosis not present

## 2023-02-25 DIAGNOSIS — F411 Generalized anxiety disorder: Secondary | ICD-10-CM | POA: Diagnosis not present

## 2023-03-05 DIAGNOSIS — F411 Generalized anxiety disorder: Secondary | ICD-10-CM | POA: Diagnosis not present

## 2023-03-11 DIAGNOSIS — F411 Generalized anxiety disorder: Secondary | ICD-10-CM | POA: Diagnosis not present

## 2023-03-17 DIAGNOSIS — Z01419 Encounter for gynecological examination (general) (routine) without abnormal findings: Secondary | ICD-10-CM | POA: Diagnosis not present

## 2023-03-17 DIAGNOSIS — Z1331 Encounter for screening for depression: Secondary | ICD-10-CM | POA: Diagnosis not present

## 2023-03-18 DIAGNOSIS — F411 Generalized anxiety disorder: Secondary | ICD-10-CM | POA: Diagnosis not present

## 2023-03-20 DIAGNOSIS — F5001 Anorexia nervosa, restricting type, mild: Secondary | ICD-10-CM | POA: Diagnosis not present

## 2023-03-20 DIAGNOSIS — B009 Herpesviral infection, unspecified: Secondary | ICD-10-CM | POA: Diagnosis not present

## 2023-03-20 DIAGNOSIS — Z23 Encounter for immunization: Secondary | ICD-10-CM | POA: Diagnosis not present

## 2023-03-20 DIAGNOSIS — F321 Major depressive disorder, single episode, moderate: Secondary | ICD-10-CM | POA: Diagnosis not present

## 2023-03-20 DIAGNOSIS — Z131 Encounter for screening for diabetes mellitus: Secondary | ICD-10-CM | POA: Diagnosis not present

## 2023-03-20 DIAGNOSIS — Z13220 Encounter for screening for lipoid disorders: Secondary | ICD-10-CM | POA: Diagnosis not present

## 2023-03-20 DIAGNOSIS — Z0000 Encounter for general adult medical examination without abnormal findings: Secondary | ICD-10-CM | POA: Diagnosis not present

## 2023-03-20 DIAGNOSIS — D508 Other iron deficiency anemias: Secondary | ICD-10-CM | POA: Diagnosis not present

## 2023-03-25 DIAGNOSIS — F411 Generalized anxiety disorder: Secondary | ICD-10-CM | POA: Diagnosis not present

## 2023-04-01 DIAGNOSIS — F411 Generalized anxiety disorder: Secondary | ICD-10-CM | POA: Diagnosis not present

## 2023-04-08 DIAGNOSIS — F411 Generalized anxiety disorder: Secondary | ICD-10-CM | POA: Diagnosis not present

## 2023-04-15 DIAGNOSIS — J329 Chronic sinusitis, unspecified: Secondary | ICD-10-CM | POA: Diagnosis not present

## 2023-04-15 DIAGNOSIS — F411 Generalized anxiety disorder: Secondary | ICD-10-CM | POA: Diagnosis not present

## 2023-04-22 DIAGNOSIS — F411 Generalized anxiety disorder: Secondary | ICD-10-CM | POA: Diagnosis not present

## 2023-04-30 DIAGNOSIS — F411 Generalized anxiety disorder: Secondary | ICD-10-CM | POA: Diagnosis not present

## 2023-05-07 DIAGNOSIS — F411 Generalized anxiety disorder: Secondary | ICD-10-CM | POA: Diagnosis not present

## 2023-05-13 DIAGNOSIS — F411 Generalized anxiety disorder: Secondary | ICD-10-CM | POA: Diagnosis not present

## 2023-05-20 DIAGNOSIS — F411 Generalized anxiety disorder: Secondary | ICD-10-CM | POA: Diagnosis not present

## 2023-07-01 DIAGNOSIS — F411 Generalized anxiety disorder: Secondary | ICD-10-CM | POA: Diagnosis not present

## 2023-07-08 DIAGNOSIS — F411 Generalized anxiety disorder: Secondary | ICD-10-CM | POA: Diagnosis not present

## 2023-07-14 DIAGNOSIS — R42 Dizziness and giddiness: Secondary | ICD-10-CM | POA: Diagnosis not present

## 2023-07-14 DIAGNOSIS — F321 Major depressive disorder, single episode, moderate: Secondary | ICD-10-CM | POA: Diagnosis not present

## 2023-07-15 DIAGNOSIS — F411 Generalized anxiety disorder: Secondary | ICD-10-CM | POA: Diagnosis not present

## 2023-07-22 DIAGNOSIS — F411 Generalized anxiety disorder: Secondary | ICD-10-CM | POA: Diagnosis not present

## 2023-07-29 DIAGNOSIS — F411 Generalized anxiety disorder: Secondary | ICD-10-CM | POA: Diagnosis not present

## 2023-08-05 DIAGNOSIS — F411 Generalized anxiety disorder: Secondary | ICD-10-CM | POA: Diagnosis not present

## 2023-08-12 DIAGNOSIS — F411 Generalized anxiety disorder: Secondary | ICD-10-CM | POA: Diagnosis not present

## 2023-08-19 DIAGNOSIS — F411 Generalized anxiety disorder: Secondary | ICD-10-CM | POA: Diagnosis not present

## 2023-08-26 DIAGNOSIS — F411 Generalized anxiety disorder: Secondary | ICD-10-CM | POA: Diagnosis not present

## 2023-09-02 DIAGNOSIS — F411 Generalized anxiety disorder: Secondary | ICD-10-CM | POA: Diagnosis not present

## 2023-09-10 DIAGNOSIS — F411 Generalized anxiety disorder: Secondary | ICD-10-CM | POA: Diagnosis not present

## 2023-09-16 DIAGNOSIS — F411 Generalized anxiety disorder: Secondary | ICD-10-CM | POA: Diagnosis not present

## 2023-09-23 DIAGNOSIS — F411 Generalized anxiety disorder: Secondary | ICD-10-CM | POA: Diagnosis not present

## 2023-09-30 DIAGNOSIS — F411 Generalized anxiety disorder: Secondary | ICD-10-CM | POA: Diagnosis not present

## 2023-10-06 DIAGNOSIS — M950 Acquired deformity of nose: Secondary | ICD-10-CM | POA: Diagnosis not present

## 2023-10-06 DIAGNOSIS — J342 Deviated nasal septum: Secondary | ICD-10-CM | POA: Diagnosis not present

## 2023-10-06 DIAGNOSIS — J343 Hypertrophy of nasal turbinates: Secondary | ICD-10-CM | POA: Diagnosis not present

## 2023-10-06 DIAGNOSIS — J3489 Other specified disorders of nose and nasal sinuses: Secondary | ICD-10-CM | POA: Diagnosis not present

## 2023-10-07 DIAGNOSIS — F411 Generalized anxiety disorder: Secondary | ICD-10-CM | POA: Diagnosis not present

## 2023-10-14 DIAGNOSIS — F411 Generalized anxiety disorder: Secondary | ICD-10-CM | POA: Diagnosis not present

## 2023-10-21 DIAGNOSIS — F411 Generalized anxiety disorder: Secondary | ICD-10-CM | POA: Diagnosis not present

## 2023-10-28 DIAGNOSIS — F411 Generalized anxiety disorder: Secondary | ICD-10-CM | POA: Diagnosis not present

## 2023-10-29 DIAGNOSIS — J342 Deviated nasal septum: Secondary | ICD-10-CM | POA: Diagnosis not present

## 2023-10-29 DIAGNOSIS — J343 Hypertrophy of nasal turbinates: Secondary | ICD-10-CM | POA: Diagnosis not present

## 2023-10-29 DIAGNOSIS — M950 Acquired deformity of nose: Secondary | ICD-10-CM | POA: Diagnosis not present

## 2023-10-29 DIAGNOSIS — J3489 Other specified disorders of nose and nasal sinuses: Secondary | ICD-10-CM | POA: Diagnosis not present

## 2023-11-04 DIAGNOSIS — Z48810 Encounter for surgical aftercare following surgery on the sense organs: Secondary | ICD-10-CM | POA: Diagnosis not present

## 2023-11-05 DIAGNOSIS — F411 Generalized anxiety disorder: Secondary | ICD-10-CM | POA: Diagnosis not present

## 2023-11-06 ENCOUNTER — Ambulatory Visit: Admitting: Neurology

## 2023-11-25 DIAGNOSIS — F411 Generalized anxiety disorder: Secondary | ICD-10-CM | POA: Diagnosis not present

## 2023-12-08 DIAGNOSIS — L718 Other rosacea: Secondary | ICD-10-CM | POA: Diagnosis not present

## 2023-12-08 DIAGNOSIS — L708 Other acne: Secondary | ICD-10-CM | POA: Diagnosis not present

## 2023-12-18 DIAGNOSIS — Z48813 Encounter for surgical aftercare following surgery on the respiratory system: Secondary | ICD-10-CM | POA: Diagnosis not present

## 2023-12-30 DIAGNOSIS — F411 Generalized anxiety disorder: Secondary | ICD-10-CM | POA: Diagnosis not present

## 2024-01-13 DIAGNOSIS — F411 Generalized anxiety disorder: Secondary | ICD-10-CM | POA: Diagnosis not present

## 2024-01-20 DIAGNOSIS — F411 Generalized anxiety disorder: Secondary | ICD-10-CM | POA: Diagnosis not present

## 2024-01-27 DIAGNOSIS — F411 Generalized anxiety disorder: Secondary | ICD-10-CM | POA: Diagnosis not present

## 2024-02-03 DIAGNOSIS — F411 Generalized anxiety disorder: Secondary | ICD-10-CM | POA: Diagnosis not present

## 2024-03-14 ENCOUNTER — Ambulatory Visit: Admitting: Neurology
